# Patient Record
Sex: Female | Born: 1960 | Race: White | Hispanic: No | Marital: Married | Smoking: Former smoker
Health system: Southern US, Community
[De-identification: ages and names within clinical notes are randomized; demographics above are authoritative.]

## PROBLEM LIST (undated history)

## (undated) HISTORY — PX: HERNIA REPAIR: SHX51

---

## 2005-03-18 ENCOUNTER — Ambulatory Visit: Payer: Self-pay | Admitting: Internal Medicine

## 2005-07-13 ENCOUNTER — Ambulatory Visit: Payer: Self-pay | Admitting: Specialist

## 2006-09-19 ENCOUNTER — Ambulatory Visit: Payer: Self-pay | Admitting: Anesthesiology

## 2007-01-16 ENCOUNTER — Ambulatory Visit: Payer: Self-pay | Admitting: General Surgery

## 2007-01-25 ENCOUNTER — Ambulatory Visit: Payer: Self-pay | Admitting: General Surgery

## 2007-01-29 ENCOUNTER — Inpatient Hospital Stay: Payer: Self-pay | Admitting: General Surgery

## 2009-09-05 ENCOUNTER — Emergency Department: Payer: Self-pay | Admitting: Internal Medicine

## 2011-07-15 ENCOUNTER — Emergency Department: Payer: Self-pay | Admitting: *Deleted

## 2011-07-16 LAB — COMPREHENSIVE METABOLIC PANEL
Albumin: 3.9 g/dL (ref 3.4–5.0)
Anion Gap: 14 (ref 7–16)
BUN: 10 mg/dL (ref 7–18)
Calcium, Total: 9.2 mg/dL (ref 8.5–10.1)
Co2: 25 mmol/L (ref 21–32)
EGFR (African American): >60
EGFR (Non-African Amer.): >60
Glucose: 94 mg/dL (ref 65–99)
Potassium: 3.7 mmol/L (ref 3.5–5.1)
SGOT(AST): 15 U/L (ref 15–37)
SGPT (ALT): 27 U/L
Sodium: 141 mmol/L (ref 136–145)

## 2011-07-16 LAB — CBC WITH DIFFERENTIAL/PLATELET
Eosinophil #: 0.1 10*3/uL (ref 0.0–0.7)
Eosinophil %: 1.3 %
HCT: 44.1 % (ref 35.0–47.0)
HGB: 15.2 g/dL (ref 12.0–16.0)
Lymphocyte #: 3.2 10*3/uL (ref 1.0–3.6)
Lymphocyte %: 29.3 %
MCH: 30.4 pg (ref 26.0–34.0)
MCHC: 34.4 g/dL (ref 32.0–36.0)
MCV: 88 fL (ref 80–100)
Monocyte #: 0.8 10*3/uL — ABNORMAL HIGH (ref 0.0–0.7)
Neutrophil %: 62 %
Platelet: 211 10*3/uL (ref 150–440)
RBC: 4.99 10*6/uL (ref 3.80–5.20)
WBC: 10.9 10*3/uL (ref 3.6–11.0)

## 2011-07-16 LAB — TROPONIN I: Troponin-I: <0.02 ng/mL

## 2011-07-16 LAB — CK TOTAL AND CKMB (NOT AT ARMC)
CK, Total: 86 U/L (ref 21–215)
CK-MB: 1.9 ng/mL (ref 0.5–3.6)

## 2011-08-10 ENCOUNTER — Ambulatory Visit: Payer: Self-pay | Admitting: Internal Medicine

## 2016-08-31 ENCOUNTER — Emergency Department
Admission: EM | Admit: 2016-08-31 | Discharge: 2016-08-31 | Disposition: A | Discharge status: Home / Self Care | Payer: BLUE CROSS/BLUE SHIELD | Attending: Emergency Medicine | Admitting: Emergency Medicine

## 2016-08-31 ENCOUNTER — Emergency Department: Payer: BLUE CROSS/BLUE SHIELD

## 2016-08-31 ENCOUNTER — Encounter: Payer: Self-pay | Admitting: Emergency Medicine

## 2016-08-31 DIAGNOSIS — S0990XA Unspecified injury of head, initial encounter: Secondary | ICD-10-CM | POA: Diagnosis present

## 2016-08-31 DIAGNOSIS — Y9389 Activity, other specified: Secondary | ICD-10-CM | POA: Insufficient documentation

## 2016-08-31 DIAGNOSIS — S060X0A Concussion without loss of consciousness, initial encounter: Secondary | ICD-10-CM | POA: Diagnosis not present

## 2016-08-31 DIAGNOSIS — Y999 Unspecified external cause status: Secondary | ICD-10-CM | POA: Insufficient documentation

## 2016-08-31 DIAGNOSIS — Z87891 Personal history of nicotine dependence: Secondary | ICD-10-CM | POA: Diagnosis not present

## 2016-08-31 DIAGNOSIS — Y92410 Unspecified street and highway as the place of occurrence of the external cause: Secondary | ICD-10-CM | POA: Diagnosis not present

## 2016-08-31 DIAGNOSIS — M542 Cervicalgia: Secondary | ICD-10-CM | POA: Insufficient documentation

## 2016-08-31 MED ORDER — TIZANIDINE HCL 4 MG PO TABS: 4.0000 mg | ORAL_TABLET | Freq: Three times a day (TID) | ORAL | 0 refills | Status: AC
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
## 2016-09-10 ENCOUNTER — Emergency Department
Admission: EM | Admit: 2016-09-10 | Discharge: 2016-09-10 | Disposition: A | Discharge status: Home / Self Care | Payer: BLUE CROSS/BLUE SHIELD | Attending: Emergency Medicine | Admitting: Emergency Medicine

## 2016-09-10 ENCOUNTER — Emergency Department: Payer: BLUE CROSS/BLUE SHIELD

## 2016-09-10 DIAGNOSIS — Z87891 Personal history of nicotine dependence: Secondary | ICD-10-CM | POA: Insufficient documentation

## 2016-09-10 DIAGNOSIS — R51 Headache: Secondary | ICD-10-CM | POA: Diagnosis not present

## 2016-09-10 DIAGNOSIS — F0781 Postconcussional syndrome: Secondary | ICD-10-CM | POA: Diagnosis not present

## 2016-09-10 NOTE — Discharge Instructions (Signed)
Your  MRI is reassuring. sometimes, postconcussive symptoms can last for some time. Please follow closely with neurology and ophthalmology, return to the emergency room for any new or worrisome symptoms including numbness or weakness that is different from what she experienced already. Any progression of symptoms or any other concerns. Avoid head injury if he can as repeated concussions can certainly cause more damage than just one.
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
## 2016-09-10 NOTE — ED Notes (Signed)
NAD noted at time of D/C. Pt denies questions or concerns. Pt ambulatory to the lobby at this time.  
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
